# Patient Record
Sex: Female | Born: 1985 | Race: White | Hispanic: No | Marital: Married | Smoking: Never smoker
Health system: Southern US, Community
[De-identification: ages and names within clinical notes are randomized; demographics above are authoritative.]

## PROBLEM LIST (undated history)

## (undated) DIAGNOSIS — F419 Anxiety disorder, unspecified: Secondary | ICD-10-CM

## (undated) DIAGNOSIS — O4590 Premature separation of placenta, unspecified, unspecified trimester: Secondary | ICD-10-CM

## (undated) DIAGNOSIS — O1490 Unspecified pre-eclampsia, unspecified trimester: Secondary | ICD-10-CM

## (undated) DIAGNOSIS — O1420 HELLP syndrome (HELLP), unspecified trimester: Secondary | ICD-10-CM

## (undated) HISTORY — DX: Morbid (severe) obesity due to excess calories: E66.01

## (undated) HISTORY — DX: Unspecified pre-eclampsia, unspecified trimester: O14.90

## (undated) HISTORY — DX: Premature separation of placenta, unspecified, unspecified trimester: O45.90

## (undated) HISTORY — PX: DEBRIDEMENT AND CLOSURE WOUND: SHX5614

## (undated) HISTORY — DX: Anxiety disorder, unspecified: F41.9

## (undated) HISTORY — DX: HELLP syndrome (HELLP), unspecified trimester: O14.20

---
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
## 2007-03-21 IMAGING — US US RENAL KIDNEY
1 series · 17 of 25 positions shown · non-contrast
Comparison: none

REASON FOR EXAM: Oliguria
COMMENTS:

[Series 1: us renal kidney · 17 of 33 slices shown]
[im 1/33]
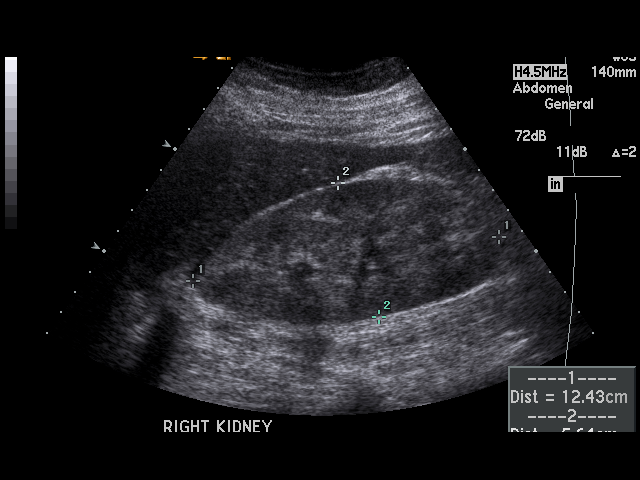
[im 3/33]
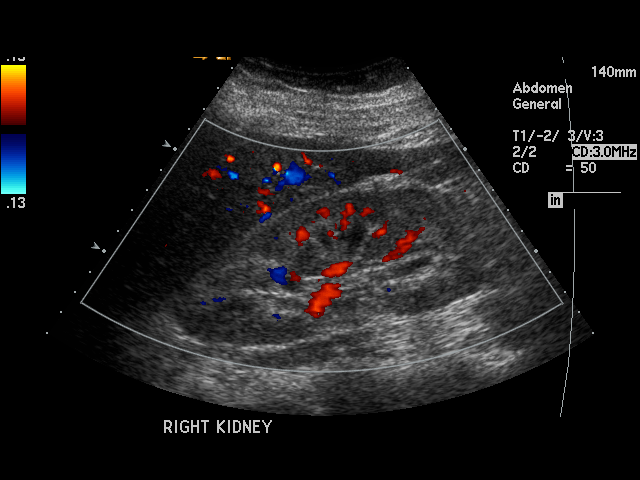
[im 5/33]
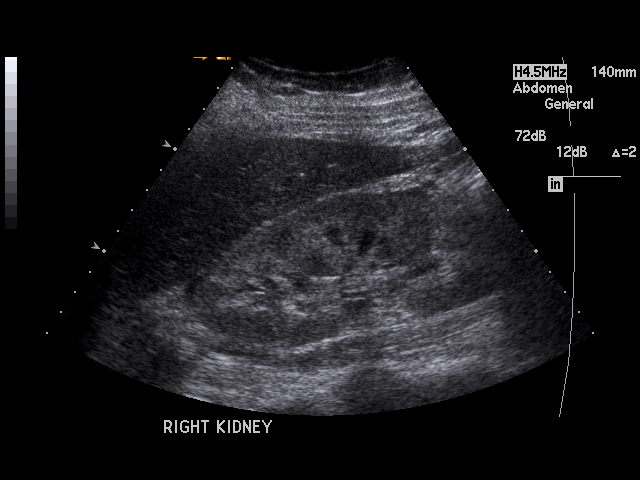
[im 7/33]
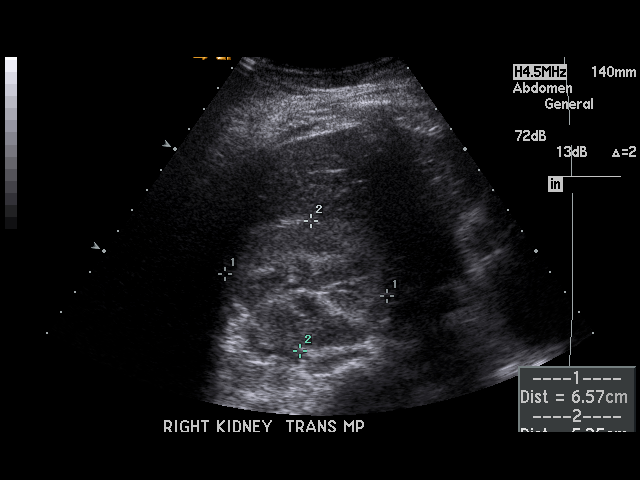
[im 9/33]
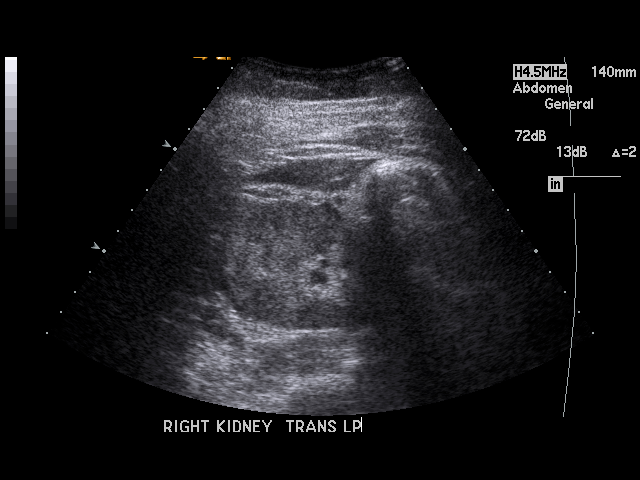
[im 11/33]
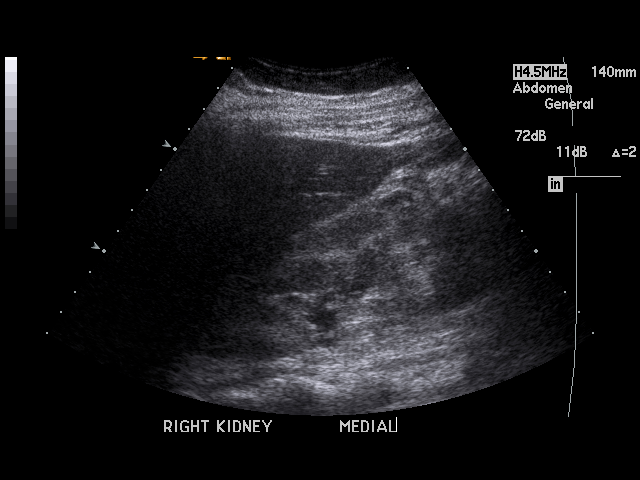
[im 13/33]
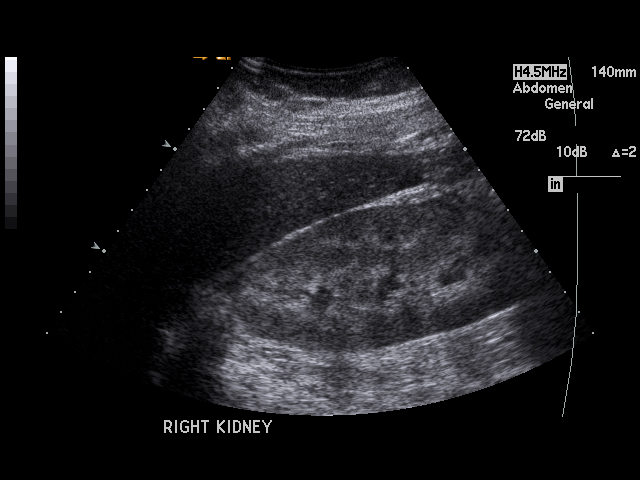
[im 15/33]
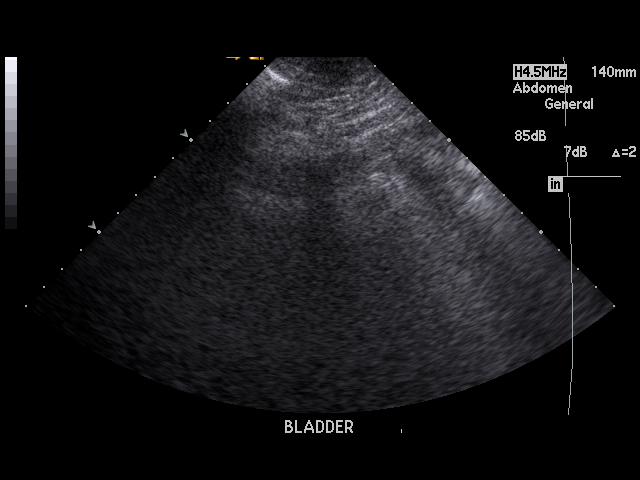
[im 17/33]
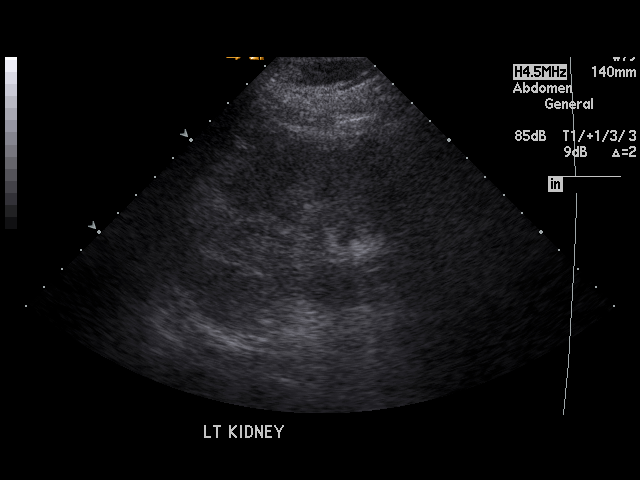
[im 18/33]
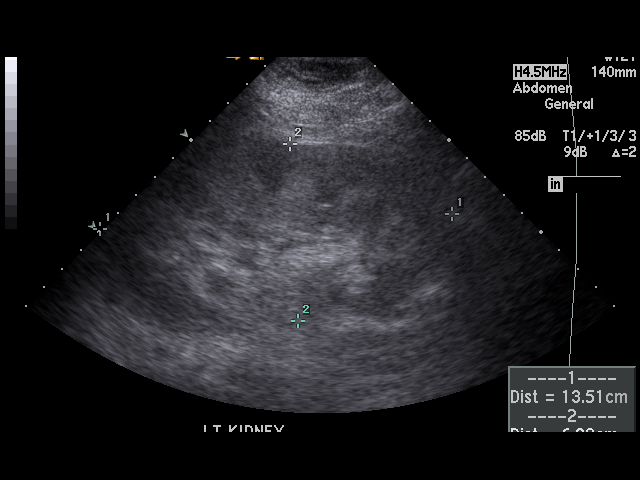
[im 21/33]
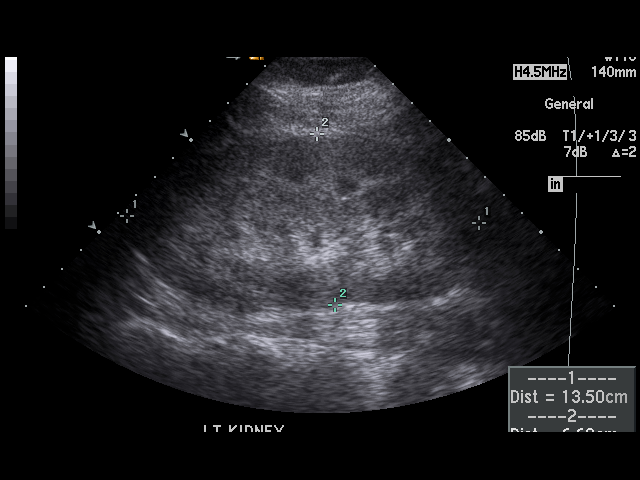
[im 22/33]
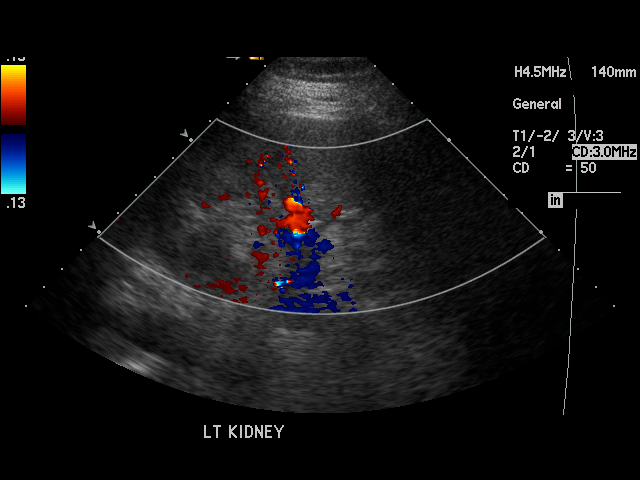
[im 25/33]
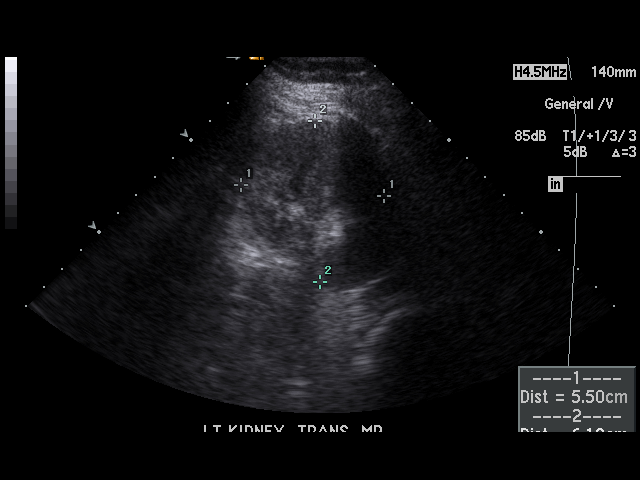
[im 26/33]
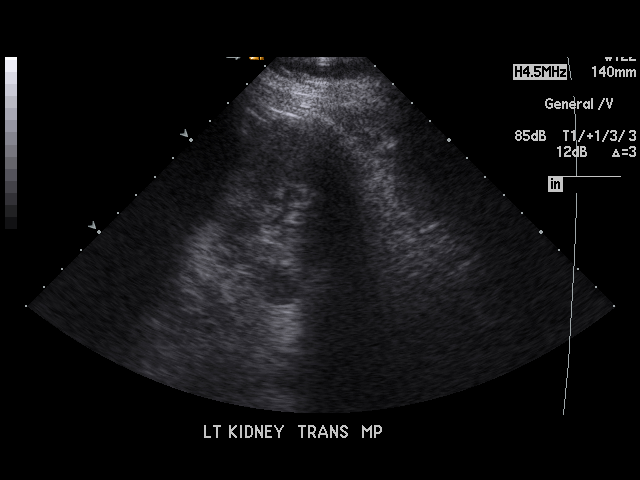
[im 29/33]
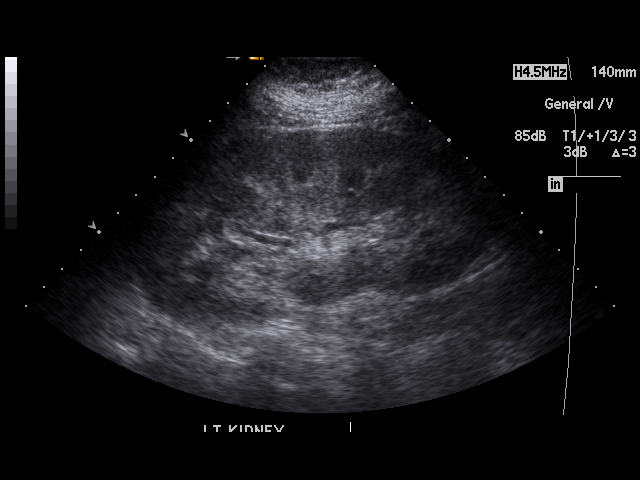
[im 30/33]
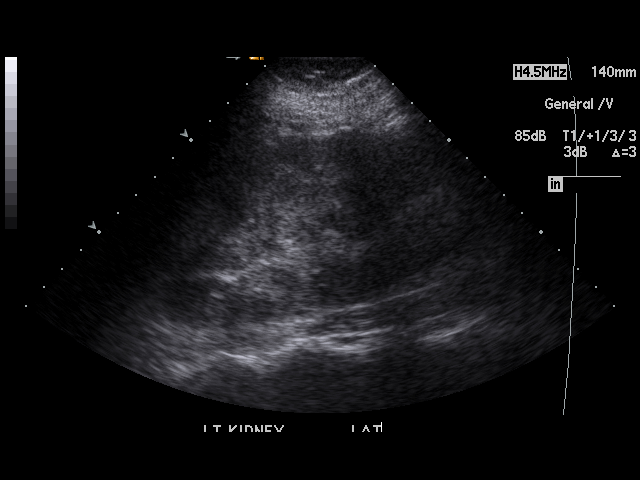
[im 33/33]
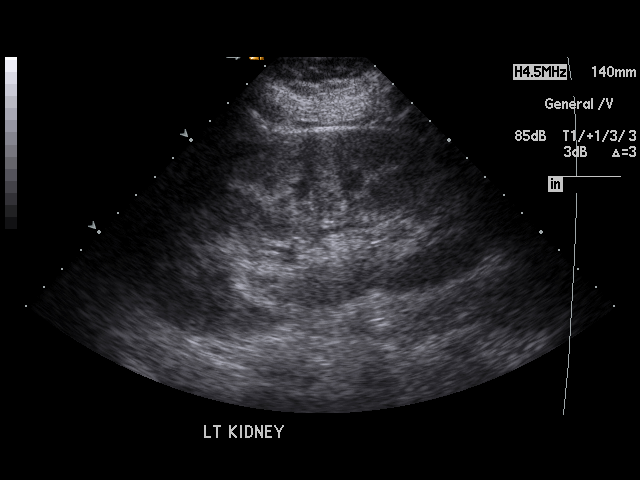

[17 of 25 positions shown; findings below may reference images not displayed]

PROCEDURE:     US  - US KIDNEY BILATERAL  - May 11, 2005 [DATE]

RESULT:          The study was done portably due to postpartum oliguria.

Both RIGHT and LEFT kidneys exhibit normal echotexture.  The LEFT kidney
measures approximately 13.5 cm in greatest dimension.  The echotexture of
the cortex is normal.  The urinary bladder was nondistended but was
partially obscured due to bowel gas.  The RIGHT kidney measures
approximately 13 cm in length.  Both kidneys demonstrate normal vascularity.
IMPRESSION: I do not see evidence of obstruction nor structural
abnormalities of the kidneys.

The findings were called to the floor at the conclusion of the study.

## 2007-08-10 ENCOUNTER — Emergency Department: Payer: Self-pay | Admitting: Emergency Medicine

## 2009-06-01 ENCOUNTER — Emergency Department: Payer: Self-pay | Admitting: Emergency Medicine

## 2013-02-15 ENCOUNTER — Ambulatory Visit: Payer: Self-pay | Admitting: Obstetrics and Gynecology

## 2014-09-12 ENCOUNTER — Emergency Department: Payer: Self-pay | Admitting: Emergency Medicine

## 2014-10-20 NOTE — Op Note (Signed)
PATIENT NAME:  Martha Mack, Martha Mack MR#:  [id number] DATE OF BIRTH:  11-04-1985  DATE OF PROCEDURE:  02/15/2013  PREOPERATIVE DIAGNOSES:  Retained Mirena intrauterine device.   POSTOPERATIVE DIAGNOSIS:  Retained Mirena intrauterine device.  OPERATION PERFORMED:  Removal of intrauterine device under anesthesia and placement of a new Mirena intrauterine device.   PRIMARY SURGEON: Florina Oundreas M. Bonney AidStaebler, MD  ESTIMATED BLOOD LOSS: Minimal.   PREOPERATIVE ANTIBIOTICS: None.   SPECIMENS REMOVED: None.   IMPLANTS: Mirena IUD.   COMPLICATIONS: None.   INTRAOPERATIVE FINDINGS: IUD was noted in the lower uterine segment on ultrasound.  It was removed using uterine packing forceps without difficulty. Inspection of the IUD noted that the ring which usually contains the IUD strings had broken, which explains why the IUD strings pulled off during attempted removal of the device by another provider in the office. The uterus sounded to 8.5 cm and the new IUD was placed without difficulty. The strings were cut to 4 cm.   PATIENT CONDITION FOLLOWING PROCEDURE: Stable.   PROCEDURE IN DETAIL: Risks, benefits and alternatives of the procedure were discussed with the patient prior to proceeding to the Operating Room. The patient was taken to the Operating Room where general anesthesia was administered and an LMA airway was administered. The patient was positioned in the dorsal lithotomy position, prepped and draped in the usual sterile fashion. A timeout procedure was performed.   A sterile speculum was placed. The anterior lip of the cervix was grasped with a single-tooth tenaculum and a uterine dressing forceps was used to remove retained intrauterine device which was undertaken without difficulty, did not require hysteroscopy. Following removal of the intrauterine device, the device was inspected. The lower portion of device which usually has a small eyelet holding the strings was noted to be broken off. The new  device was placed after sounding the uterus to 8.5 cm using a uterine sound. The IUD was placed without difficulty. Strings were cut at 4 cm.   The patient will return in 6 weeks for string check and trimming of strings if needed. Sponge, needle, and instrument counts were correct x 2. The patient tolerated the procedure well and was taken to the recovery room in stable condition.      ____________________________ Florina OuAndreas M. Bonney AidStaebler, MD ams:cs D: 02/16/2013 19:56:49 ET T: 02/16/2013 20:56:47 ET JOB#: [id number]  cc: Florina OuAndreas M. Bonney AidStaebler, MD, <Dictator> Lorrene ReidANDREAS M Eulises Kijowski MD ELECTRONICALLY SIGNED 03/01/2013 22:42

## 2016-07-22 IMAGING — CR DG ANKLE COMPLETE 3+V*L*
1 series · 3 of 3 positions shown · non-contrast
Comparison: None.

CLINICAL DATA: Patient has posterior ankle and heel pain since
[REDACTED]. No known injury.

EXAM:
LEFT ANKLE COMPLETE - 3+ VIEW

[Series 1: ap · 0.17mm/px · 3 of 3 slices shown]
[im 1/3]
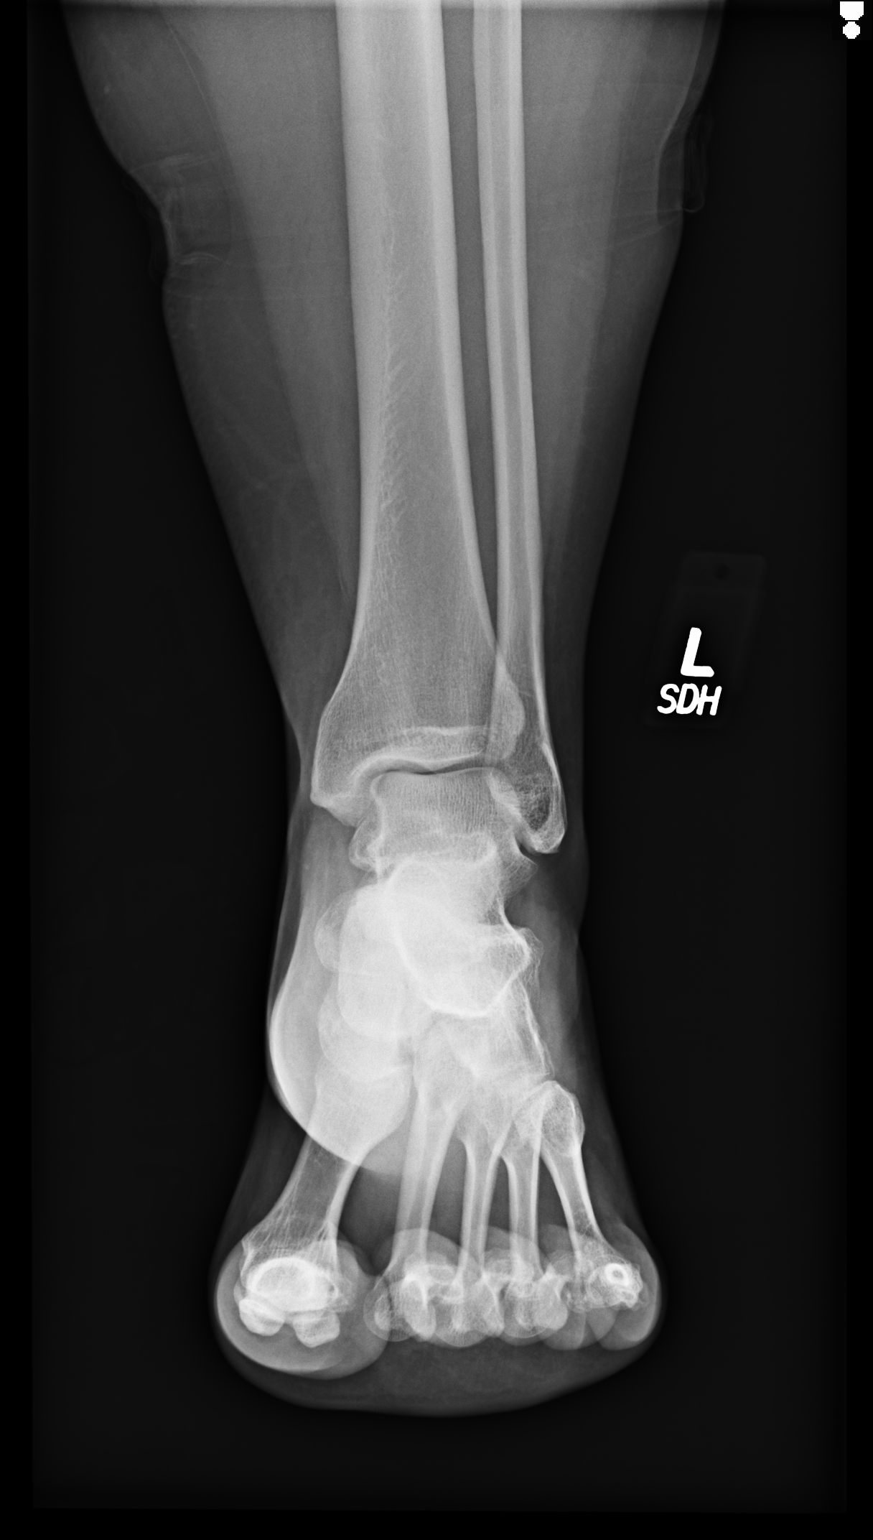
[im 2/3]
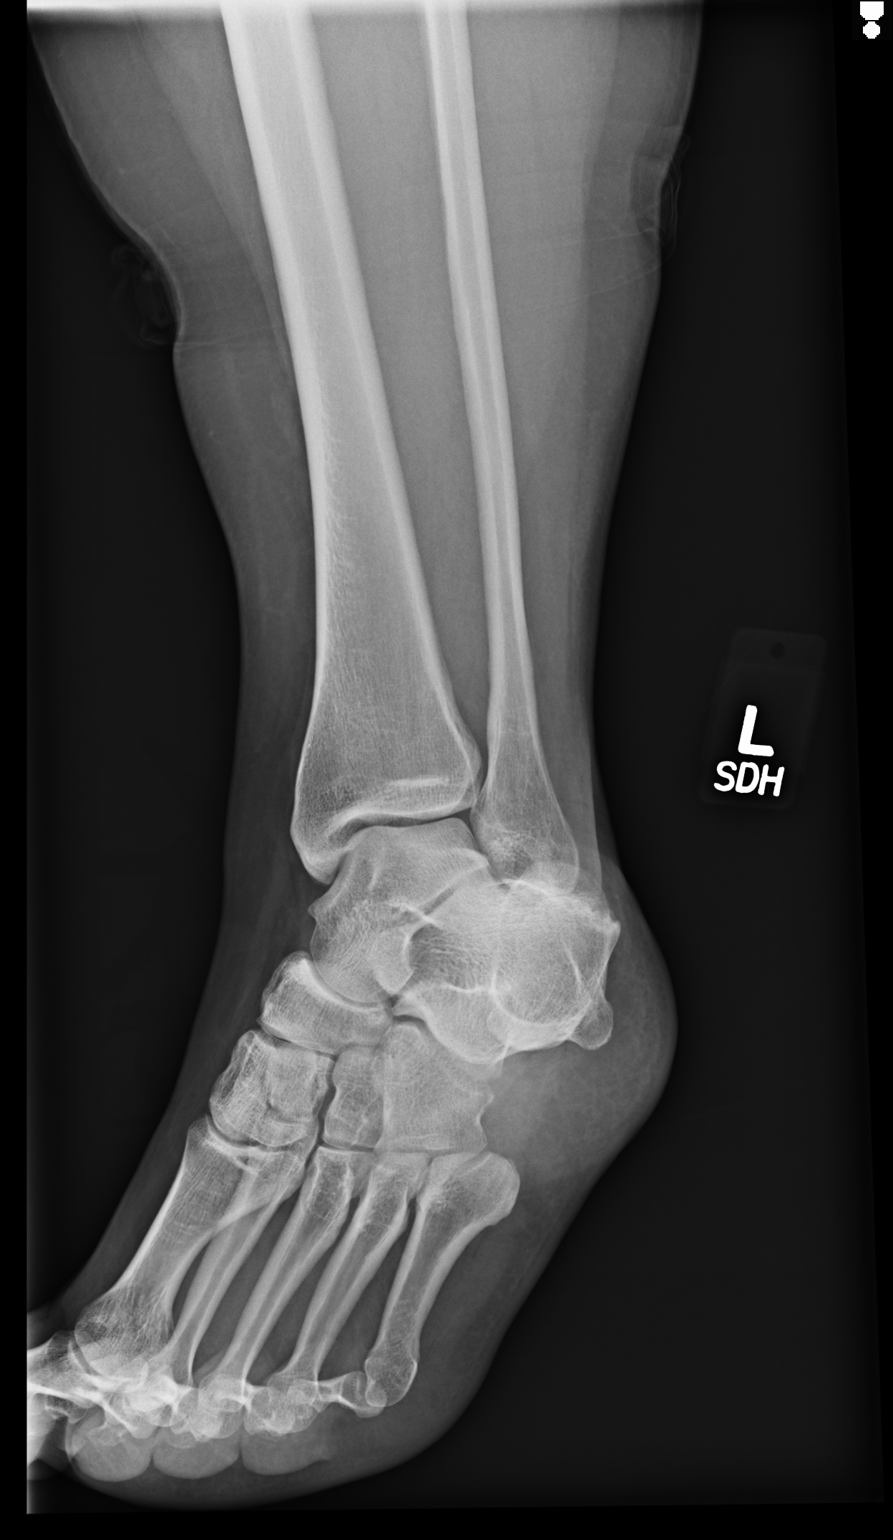
[im 3/3]
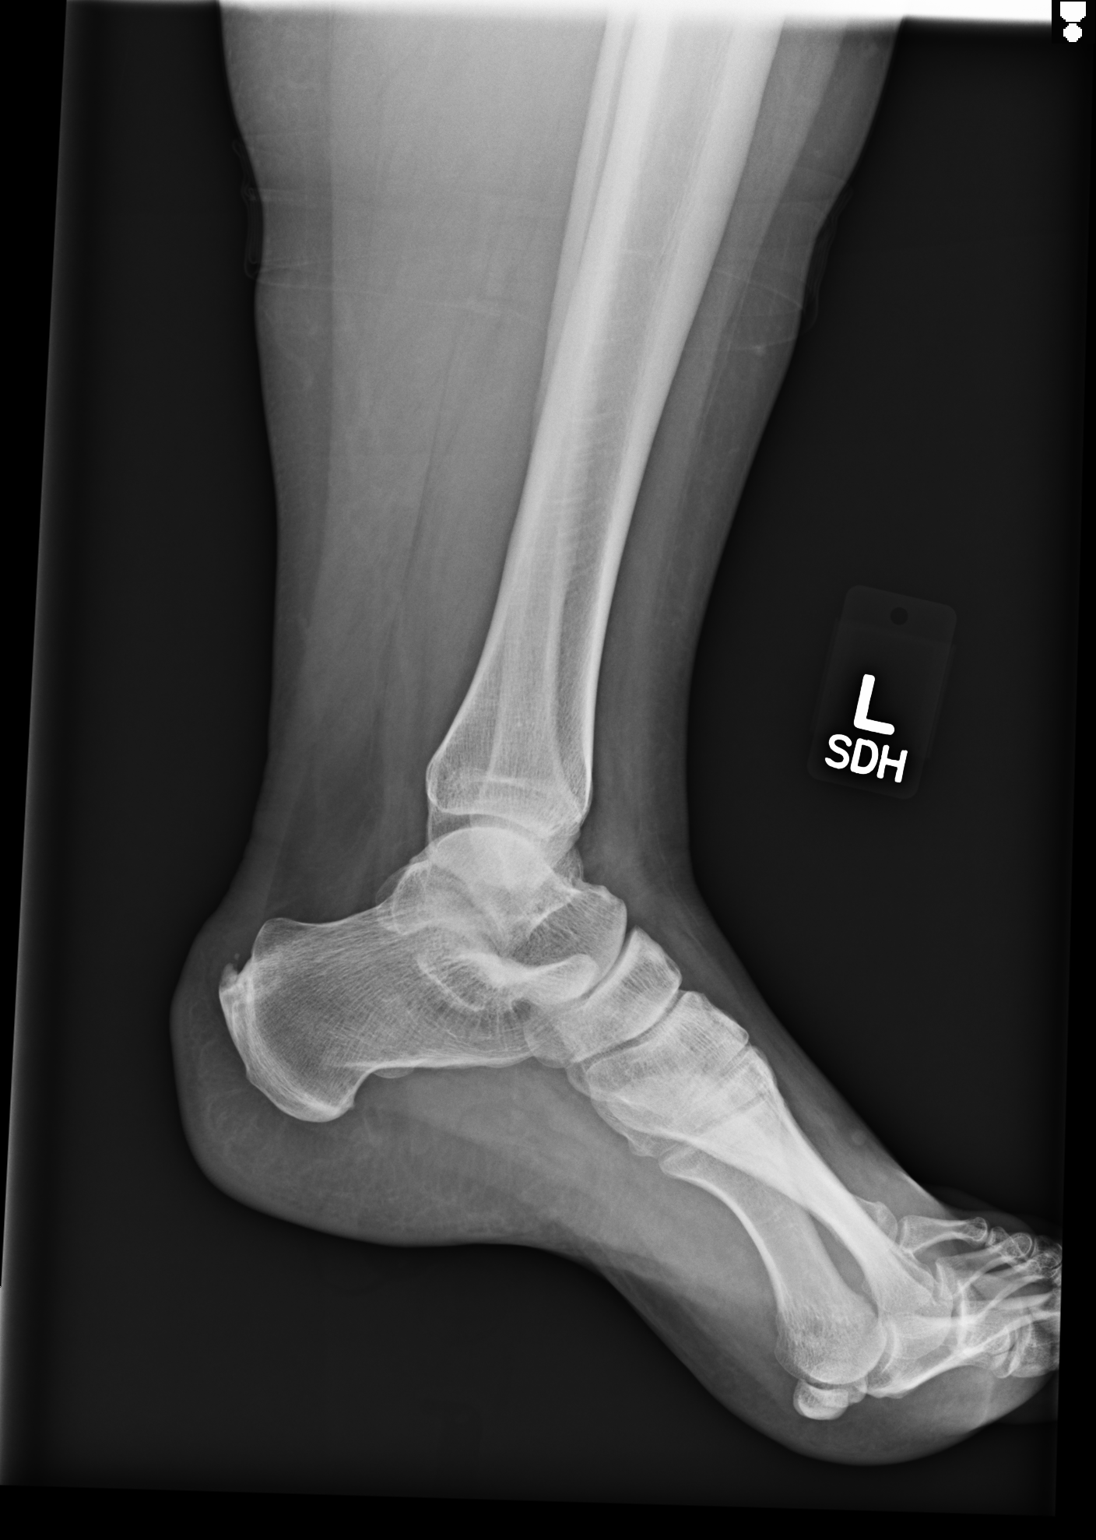

[3 of 3 positions shown; findings below may reference images not displayed]

FINDINGS: There is no evidence of fracture, dislocation, or joint effusion.
Soft tissues are unremarkable. Small Achilles spur present.
IMPRESSION: No evidence for acute  abnormality.

## 2017-04-11 ENCOUNTER — Encounter: Payer: Self-pay | Admitting: Emergency Medicine

## 2017-04-11 DIAGNOSIS — Z5321 Procedure and treatment not carried out due to patient leaving prior to being seen by health care provider: Secondary | ICD-10-CM | POA: Insufficient documentation

## 2017-04-11 LAB — URINALYSIS, COMPLETE (UACMP) WITH MICROSCOPIC
BILIRUBIN URINE: NEGATIVE
GLUCOSE, UA: NEGATIVE mg/dL
KETONES UR: 5 mg/dL — AB
Nitrite: NEGATIVE
PH: 5 (ref 5.0–8.0)
PROTEIN: 30 mg/dL — AB
Specific Gravity, Urine: 1.034 — ABNORMAL HIGH (ref 1.005–1.030)

## 2017-04-11 LAB — POCT PREGNANCY, URINE: Preg Test, Ur: NEGATIVE

## 2017-04-11 NOTE — ED Triage Notes (Signed)
Pt arrives ambulatory to triage with c/o stiff neck x 1 week. Pt denies injury or trauma to neck. Pt is able to lower her chin to neck and raise it up as well. However, pt states that she unable to turn her head left and right. Pt is able to talk in complete sentences without distress and is otherwise in NAD.

## 2017-04-12 ENCOUNTER — Emergency Department
Admission: EM | Admit: 2017-04-12 | Discharge: 2017-04-12 | Disposition: A | Discharge status: Home / Self Care | Payer: Self-pay | Attending: Emergency Medicine | Admitting: Emergency Medicine

## 2018-12-09 ENCOUNTER — Telehealth: Payer: Self-pay | Admitting: Certified Nurse Midwife

## 2018-12-09 NOTE — Telephone Encounter (Signed)
Noted. Will order to arrive by apt date/time. 

## 2018-12-09 NOTE — Telephone Encounter (Signed)
6/30 at 130 with clg for mirena

## 2018-12-28 ENCOUNTER — Other Ambulatory Visit (HOSPITAL_COMMUNITY)
Admission: RE | Admit: 2018-12-28 | Discharge: 2018-12-28 | Disposition: A | Discharge status: Home / Self Care | Payer: 59 | Source: Ambulatory Visit | Attending: Certified Nurse Midwife | Admitting: Certified Nurse Midwife

## 2018-12-28 ENCOUNTER — Other Ambulatory Visit: Payer: Self-pay

## 2018-12-28 ENCOUNTER — Encounter: Payer: Self-pay | Admitting: Certified Nurse Midwife

## 2018-12-28 ENCOUNTER — Ambulatory Visit: Payer: 59 | Admitting: Certified Nurse Midwife

## 2018-12-28 VITALS — BP 124/78 | Ht 66.0 in | Wt 261.0 lb

## 2018-12-28 DIAGNOSIS — Z124 Encounter for screening for malignant neoplasm of cervix: Secondary | ICD-10-CM | POA: Diagnosis present

## 2018-12-28 DIAGNOSIS — Z113 Encounter for screening for infections with a predominantly sexual mode of transmission: Secondary | ICD-10-CM

## 2018-12-28 DIAGNOSIS — Z30433 Encounter for removal and reinsertion of intrauterine contraceptive device: Secondary | ICD-10-CM

## 2018-12-28 DIAGNOSIS — Z87448 Personal history of other diseases of urinary system: Secondary | ICD-10-CM

## 2018-12-28 DIAGNOSIS — F419 Anxiety disorder, unspecified: Secondary | ICD-10-CM

## 2018-12-28 DIAGNOSIS — Z6841 Body Mass Index (BMI) 40.0 and over, adult: Secondary | ICD-10-CM

## 2018-12-28 DIAGNOSIS — Z131 Encounter for screening for diabetes mellitus: Secondary | ICD-10-CM

## 2018-12-28 DIAGNOSIS — Z13220 Encounter for screening for lipoid disorders: Secondary | ICD-10-CM

## 2018-12-28 NOTE — Telephone Encounter (Signed)
Mirena IUD used for pt today with CG

## 2018-12-29 ENCOUNTER — Encounter: Payer: Self-pay | Admitting: Certified Nurse Midwife

## 2018-12-29 DIAGNOSIS — O1490 Unspecified pre-eclampsia, unspecified trimester: Secondary | ICD-10-CM | POA: Insufficient documentation

## 2018-12-29 DIAGNOSIS — O4590 Premature separation of placenta, unspecified, unspecified trimester: Secondary | ICD-10-CM | POA: Insufficient documentation

## 2018-12-29 DIAGNOSIS — F419 Anxiety disorder, unspecified: Secondary | ICD-10-CM | POA: Insufficient documentation

## 2018-12-29 DIAGNOSIS — O1420 HELLP syndrome (HELLP), unspecified trimester: Secondary | ICD-10-CM | POA: Insufficient documentation

## 2018-12-29 LAB — CMP14+EGFR
ALT: 26 IU/L (ref 0–32)
AST: 18 IU/L (ref 0–40)
Albumin/Globulin Ratio: 1.9 (ref 1.2–2.2)
Albumin: 4.6 g/dL (ref 3.8–4.8)
Alkaline Phosphatase: 44 IU/L (ref 39–117)
BUN/Creatinine Ratio: 11 (ref 9–23)
BUN: 8 mg/dL (ref 6–20)
Bilirubin Total: 0.5 mg/dL (ref 0.0–1.2)
CO2: 17 mmol/L — ABNORMAL LOW (ref 20–29)
Calcium: 9.5 mg/dL (ref 8.7–10.2)
Chloride: 103 mmol/L (ref 96–106)
Creatinine, Ser: 0.71 mg/dL (ref 0.57–1.00)
GFR calc Af Amer: 131 mL/min/{1.73_m2} (ref >59)
GFR calc non Af Amer: 114 mL/min/{1.73_m2} (ref >59)
Globulin, Total: 2.4 g/dL (ref 1.5–4.5)
Glucose: 92 mg/dL (ref 65–99)
Potassium: 4 mmol/L (ref 3.5–5.2)
Sodium: 139 mmol/L (ref 134–144)
Total Protein: 7 g/dL (ref 6.0–8.5)

## 2018-12-29 LAB — LIPID PANEL
Chol/HDL Ratio: 4.4 ratio (ref 0.0–4.4)
Cholesterol, Total: 192 mg/dL (ref 100–199)
HDL: 44 mg/dL (ref >39)
LDL Calculated: 124 mg/dL — ABNORMAL HIGH (ref 0–99)
Triglycerides: 119 mg/dL (ref 0–149)
VLDL Cholesterol Cal: 24 mg/dL (ref 5–40)

## 2018-12-29 LAB — TSH: TSH: 2.26 u[IU]/mL (ref 0.450–4.500)

## 2018-12-29 LAB — HEMOGLOBIN A1C
Est. average glucose Bld gHb Est-mCnc: 103 mg/dL
Hgb A1c MFr Bld: 5.2 % (ref 4.8–5.6)

## 2018-12-29 NOTE — Progress Notes (Addendum)
Obstetrics & Gynecology Office Visit   Chief Complaint:  Chief Complaint  Patient presents with  . Contraception    IUD replacement     History of Present Illness: 33 year old G2 P1101 presented as a NP for an IUD replacement. She had her last Mirena IUD removed under anesthesia and the current Mirena was inserted 02/15/2013. She has not been seen at [facility] since then and has not had a gyn exam or Pap smear since 12/30/2012. She reports that she has been amenorrheic, spotting on rare occasion, like today. Is very anxious regarding this procedure.  Past medical history is significant for migraine with aura (about once every 2 months) and tension headaches (once/week), obesity and anxiety.  She has difficulty losing weight by limiting caloric intake to 1700 cal/day and walking 3x/week.  Her past medical history is also significant for having Cesarean sections x 2, fetal loss with her second pregnancy from placental abruption due to HELLP/ severe preeclampsia (also had renal failure), and postpartum depression.  Last Pap smear: 12/30/2012 NIL    Review of Systems:  Review of Systems  Constitutional: Negative for chills, fever and weight loss.  HENT: Negative for congestion, sinus pain and sore throat.   Eyes: Negative for blurred vision and pain.  Respiratory: Negative for hemoptysis, shortness of breath and wheezing.   Cardiovascular: Negative for chest pain, palpitations and leg swelling.  Gastrointestinal: Negative for abdominal pain, blood in stool, diarrhea, heartburn, nausea and vomiting.  Genitourinary: Negative for dysuria, frequency, hematuria and urgency.  Musculoskeletal: Positive for joint pain. Negative for back pain and myalgias.  Skin: Negative for itching and rash.  Neurological: Positive for headaches. Negative for dizziness and tingling.  Endo/Heme/Allergies: Negative for environmental allergies and polydipsia. Does not bruise/bleed easily.       Negative for hirsutism    Psychiatric/Behavioral: Negative for depression. The patient is nervous/anxious. The patient does not have insomnia.      Past Medical History:  Past Medical History:  Diagnosis Date  . Anxiety   . Fetal demise, greater than 22 weeks, antepartum 2006   from abruption with G2  . HELLP syndrome    2006-with renal failure  . Morbid obesity with BMI of 40.0-44.9, adult ([place])   . Placental abruption   . Postpartum depression associated with second pregnancy 2007  . Pre-eclampsia    G2    Past Surgical History:  Past Surgical History:  Procedure Laterality Date  . CESAREAN SECTION  2005/2006  . DEBRIDEMENT AND CLOSURE WOUND    . HYSTEROSCOPY W/D&C  02/27/2004  . INTRAUTERINE DEVICE (IUD) INSERTION  02/16/2013  . IUD REMOVAL  02/16/2013    Gynecologic History: No LMP recorded. (Menstrual status: IUD).  Obstetric History: G2P1101  Family History:  Family History  Problem Relation Age of Onset  . Stroke Father   . Heart disease Maternal Grandfather   . Breast cancer Other 79  . Cerebral palsy Paternal Aunt   . Lung cancer Other   . Heart disease Maternal Uncle     Social History:  Social History   Socioeconomic History  . Marital status: Married    Spouse name: Darnelle Maffucci  . Number of children: 1  . Years of education: Not on file  . Highest education level: Not on file  Occupational History  . Not on file  Social Needs  . Financial resource strain: Not on file  . Food insecurity    Worry: Not on file    Inability:  Not on file  . Transportation needs    Medical: Not on file    Non-medical: Not on file  Tobacco Use  . Smoking status: Never Smoker  . Smokeless tobacco: Never Used  Substance and Sexual Activity  . Alcohol use: No  . Drug use: No  . Sexual activity: Yes    Birth control/protection: I.U.D.  Lifestyle  . Physical activity    Days per week: Not on file    Minutes per session: Not on file  . Stress: Not on file  Relationships  . Social  Herbalist on phone: Not on file    Gets together: Not on file    Attends religious service: Not on file    Active member of club or organization: Not on file    Attends meetings of clubs or organizations: Not on file    Relationship status: Not on file  . Intimate partner violence    Fear of current or ex partner: Not on file    Emotionally abused: Not on file    Physically abused: Not on file    Forced sexual activity: Not on file  Other Topics Concern  . Not on file  Social History Narrative  . Not on file    Allergies:  Allergies  Allergen Reactions  . Ceclor [Cefaclor] Hives  . Morphine And Related Hives    Medications: Prior to Admission medications   Medication Sig Start Date End Date Taking? Authorizing Provider  levonorgestrel (MIRENA) 20 MCG/24HR IUD 1 each by Intrauterine route once.   Yes [provider]    Physical Exam Vitals:BP 124/78   Ht 5' 6"  (1.676 m)   Wt 261 lb (118.4 kg)   BMI 42.13 kg/m       Physical Exam  Constitutional: She is oriented to person, place, and time.  Pleasant, WF, appears to be very anxious  HENT:  Head: Normocephalic and atraumatic.  GI: Soft. She exhibits no distension and no mass. There is no abdominal tenderness.  Genitourinary:    Genitourinary Comments: Vulva: no lesions Vagina: small amount brown discharge Cervix: IUD strings seen, no lesions Uterus: AV. NSSC, mobile, NT Adnexa: no masses, NT    Neurological: She is alert and oriented to person, place, and time.  Psychiatric:  Anxious     Assessment: 33 y.o. [id number] NP for IUD replacement  Plan: Pap smear and cultures done Labs today after IUD replacement Problem List Items Addressed This Visit      Other   Anxiety    Other Visit Diagnoses    Screening for STD (sexually transmitted disease)    -  Primary   Relevant Orders   Cytology - PAP   Screening for cervical cancer       Relevant Orders   Cytology - PAP   Class 3 severe  obesity due to excess calories without serious comorbidity with body mass index (BMI) of 40.0 to 44.9 in adult ([place])       Relevant Orders   TSH (Completed)   Hemoglobin A1c (Completed)   Screening for diabetes mellitus       Relevant Orders   Hemoglobin A1c (Completed)   Screening for hypercholesterolemia       Relevant Orders   Lipid panel (Completed)   History of renal failure       Relevant Orders   CMP14+EGFR (Completed)   Encounter for removal and reinsertion of intrauterine contraceptive device (IUD)  See procedure note below RTO 1 mos for follow up and completion of annual exam  Dalia Heading, [place] OFFICE PROCEDURE NOTE  LASHYA PASSE is a 33 y.o. G2P1101 here for Mirena IUD replacement.  Last pap smear was done today and results are pending  IUD Insertion Procedure Note Patient identified, informed consent performed, consent signed.   Discussed risks of irregular bleeding, cramping, infection, expulsion,malpositioning or misplacement of the IUD outside the uterus which may require further procedure such as laparoscopy. Time out was performed.    On bimanual exam, uterus was Anteverted. Speculum placed in the vagina.  Cervix visualized. IUD strings grasped with a packing forceps and the IUD was removed easily and intact. The cervix was then cleaned with Betadine x 2. Cervix was sprayed with Hurricaine anesthetic and  grasped anteriorly with a single tooth tenaculum.  Uterus sounded to 6 cm.  Mirena  IUD placed per manufacturer's recommendations.  Strings trimmed to 3 cm. Tenaculum was removed, and silver nitrate was applied to tenaculum sites for hemostasis.  Patient tolerated procedure well.   Patient was given post-procedure instructions.  Patient was also asked to check IUD strings periodically and follow up in 4 weeks for IUD check/ annual exam.  Dalia Heading, CNM 12/29/18

## 2018-12-30 LAB — CYTOLOGY - PAP
Chlamydia: NEGATIVE
Diagnosis: NEGATIVE
HPV: NOT DETECTED
Neisseria Gonorrhea: NEGATIVE

## 2018-12-30 NOTE — Telephone Encounter (Signed)
Mirena/removal/insertion charged 12/28/2018

## 2019-01-27 ENCOUNTER — Ambulatory Visit: Payer: 59 | Admitting: Certified Nurse Midwife

## 2019-02-22 ENCOUNTER — Ambulatory Visit: Payer: 59 | Admitting: Certified Nurse Midwife

## 2019-04-07 ENCOUNTER — Ambulatory Visit: Payer: 59 | Admitting: Certified Nurse Midwife

## 2019-05-20 ENCOUNTER — Ambulatory Visit: Payer: 59 | Admitting: Certified Nurse Midwife

## 2020-01-11 ENCOUNTER — Ambulatory Visit: Payer: 59 | Admitting: Internal Medicine

## 2022-01-30 ENCOUNTER — Encounter (INDEPENDENT_AMBULATORY_CARE_PROVIDER_SITE_OTHER): Payer: 59 | Admitting: Ophthalmology

## 2022-01-31 ENCOUNTER — Encounter (INDEPENDENT_AMBULATORY_CARE_PROVIDER_SITE_OTHER): Payer: Self-pay | Admitting: Ophthalmology

## 2022-01-31 ENCOUNTER — Ambulatory Visit (INDEPENDENT_AMBULATORY_CARE_PROVIDER_SITE_OTHER): Payer: BC Managed Care – PPO | Admitting: Ophthalmology

## 2022-01-31 DIAGNOSIS — H33311 Horseshoe tear of retina without detachment, right eye: Secondary | ICD-10-CM | POA: Diagnosis not present

## 2022-01-31 DIAGNOSIS — H53002 Unspecified amblyopia, left eye: Secondary | ICD-10-CM

## 2022-01-31 NOTE — Progress Notes (Signed)
[facility] - Clinic Note  01/31/2022     CHIEF COMPLAINT Patient presents for Retina Evaluation   HISTORY OF PRESENT ILLNESS: Martha Mack is a 36 y.o. female who presents to the clinic today for:   HPI     Retina Evaluation   In right eye.  This started 2 weeks ago.  Duration of 6 days.  Associated Symptoms Floaters.  I, the attending physician,  performed the HPI with the patient and updated documentation appropriately.        Comments   Patient here for Retina Evaluation. Referred by Dr Clydene Pugh. Patient states vision OD floaters and black spots not as bad and was 3 days ago. Was seeing floaters and a lot of black spots like pepper falling and double vision. Double vision comes and goes. The floaters are staying and black spots not as noticeable. No eye pain.       Last edited by Rennis Chris, MD on 01/31/2022 12:59 PM.    Pt is here on the referral of Dr. 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 [age] years old, she used a patch for awhile, she states about 2 weeks ago, she got new floaters and "black specks" in her right eye, she normally gets floaters before a migraine and then they go away, but these have stayed around, she states a few months ago, she saw fol that looked like "lightening streaks", but they have gone away, she states the floaters have moved temporally, pt denies any health problems or taking any medication, pt states her dog hit her in the right eye with his tail about 3 weeks ago  Referring physician: Isla Pence, OD [address] [place],  [place] [address]  HISTORICAL INFORMATION:   Selected notes from the MEDICAL RECORD NUMBER Referred by Dr. Clydene Pugh for concern of retinal hole OD LEE:  Ocular Hx- PMH-    CURRENT MEDICATIONS: No current outpatient medications on file. (Ophthalmic Drugs)   No current facility-administered medications for this visit. (Ophthalmic Drugs)   Current  Outpatient Medications (Other)  Medication Sig   ALPRAZolam (XANAX) 0.5 MG tablet TAKE 1 TABLET BY MOUTH BEFORE FLYING AS NEEDED (Patient not taking: Reported on 01/31/2022)   levonorgestrel (MIRENA) 20 MCG/24HR IUD 1 each by Intrauterine route once.   metoprolol succinate (TOPROL-XL) 50 MG 24 hr tablet Take 1 tablet by mouth daily. (Patient not taking: Reported on 01/31/2022)   No current facility-administered medications for this visit. (Other)   REVIEW OF SYSTEMS: ROS   Positive for: Eyes Negative for: Constitutional, Gastrointestinal, Neurological, Skin, Genitourinary, Musculoskeletal, HENT, Endocrine, Cardiovascular, Respiratory, Psychiatric, Allergic/Imm, Heme/Lymph Last edited by Rennis Chris, MD on 01/31/2022  4:05 PM.     ALLERGIES Allergies  Allergen Reactions   Ceclor [Cefaclor] Hives   Morphine And Related Hives   PAST MEDICAL HISTORY Past Medical History:  Diagnosis Date   Anxiety    Fetal demise, greater than 22 weeks, antepartum 2006   from abruption with G2   HELLP syndrome    2006-with renal failure   Morbid obesity with BMI of 40.0-44.9, adult ([facility])    Placental abruption    Postpartum depression associated with second pregnancy 2007   Pre-eclampsia    G2   Past Surgical History:  Procedure Laterality Date   CESAREAN SECTION  2005/2006   DEBRIDEMENT AND CLOSURE WOUND     HYSTEROSCOPY WITH D & C  02/27/2004  INTRAUTERINE DEVICE (IUD) INSERTION  02/16/2013   IUD REMOVAL  02/16/2013   FAMILY HISTORY Family History  Problem Relation Age of Onset   Stroke Father    Heart disease Maternal Grandfather    Breast cancer Other 8   Cerebral palsy Paternal Aunt    Lung cancer Other    Heart disease Maternal Uncle    SOCIAL HISTORY Social History   Tobacco Use   Smoking status: Never   Smokeless tobacco: Never  Vaping Use   Vaping Use: Never used  Substance Use Topics   Alcohol use: No   Drug use: No       OPHTHALMIC EXAM:  Base Eye Exam      Visual Acuity (Snellen - Linear)       Right Left   Dist [place] 20/20 20/200 -1   Dist ph [place]  20/200 +2         Tonometry (Tonopen, 9:54 AM)       Right Left   Pressure 18 20         Pupils       Dark Light Shape React APD   Right 3 2 Round Brisk None   Left 3 2 Round Brisk None         Visual Fields (Counting fingers)       Left Right    Full Full         Extraocular Movement       Right Left    Full, Ortho Full, Ortho         Neuro/Psych     Oriented x3: Yes   Mood/Affect: Normal         Dilation     Both eyes: 1.0% Mydriacyl, 2.5% Phenylephrine @ 9:54 AM           Slit Lamp and Fundus Exam     Slit Lamp Exam       Right Left   Lids/Lashes Dermatochalasis - upper lid, Meibomian gland dysfunction Dermatochalasis - upper lid, Meibomian gland dysfunction   Conjunctiva/Sclera White and quiet White and quiet   Cornea trace PEE trace PEE   Anterior Chamber deep and clear deep and clear   Iris Round and dilated Round and dilated   Lens Clear Clear   Anterior Vitreous clear clear         Fundus Exam       Right Left   Disc Pink and Sharp Pink and Sharp, focal PPP temporal   C/D Ratio 0.4 0.3   Macula Flat, Good foveal reflex, No heme or edema Flat, Good foveal reflex, No heme or edema   Vessels mild copper wiring Normal   Periphery Attached, flap tear at 130 ora Attached           Refraction     Manifest Refraction (Auto)       Sphere Cylinder Axis Dist VA   Right +0.50 Sphere  20/20   Left +4.75 +2.00 071 20/80            IMAGING AND PROCEDURES  Imaging and Procedures for 01/31/2022  OCT, Retina - OU - Both Eyes       Right Eye Quality was good. Central Foveal Thickness: 288. Progression has no prior data. Findings include normal foveal contour, no IRF, no SRF, vitreomacular adhesion .   Left Eye Quality was good. Central Foveal Thickness: 303. Progression has no prior data. Findings include normal foveal contour,  no IRF, no SRF.   Notes *  Images captured and stored on drive  Diagnosis / Impression:  NFP, no IRF/SRF OU  Clinical management:  See below  Abbreviations: NFP - Normal foveal profile. CME - cystoid macular edema. PED - pigment epithelial detachment. IRF - intraretinal fluid. SRF - subretinal fluid. EZ - ellipsoid zone. ERM - epiretinal membrane. ORA - outer retinal atrophy. ORT - outer retinal tubulation. SRHM - subretinal hyper-reflective material. IRHM - intraretinal hyper-reflective material      Repair Retinal Breaks, Laser - OD - Right Eye       LASER PROCEDURE NOTE  Procedure:  Barrier laser retinopexy using laser indirect ophthalmoscope, right eye   Diagnosis:   Retinal tear, right eye                     Flap tear at 130 ora   Surgeon: Bernarda Caffey, MD, PhD  Anesthesia: Topical  Informed consent obtained, operative eye marked, and time out performed prior to initiation of laser.   Laser settings:  Lumenis [id number] laser indirect ophthalmoscope Power: 270 mW Duration: 70 msec  # spots: 340  Placement of laser: Laser was placed in three confluent rows posterior to flap tear at 130 ora.  Complications: None.  Patient tolerated the procedure well and received written and verbal post-procedure care information/education.            ASSESSMENT/PLAN:    ICD-10-CM   1. Retinal tear of right eye  H33.311 OCT, Retina - OU - Both Eyes    Repair Retinal Breaks, Laser - OD - Right Eye    2. Amblyopia of left eye  H53.002      Retinal tear, OD - The incidence, risk factors, and natural history of retinal tear was discussed with patient.   - Potential treatment options including laser retinopexy and cryotherapy discussed with patient. - small flap tear located at 0130 ora - recommend laser retinopexy today OD today, 08.04.23 - pt wishes to proceed with laser - RBA of procedure discussed, questions answered - informed consent obtained and signed - see  procedure note - start PF QID OD x7 days - f/u in 2-3 wks, DFE, OCT  2. Amblyopia OS  - patched as a child  - BCVA ~20/80 by MRx  Ophthalmic Meds Ordered this visit:  No orders of the defined types were placed in this encounter.    Return for 2-3 wks - f/u retinal tear OD, DFE, OCT.  There are no Patient Instructions on file for this visit.   Explained the diagnoses, plan, and follow up with the patient and they expressed understanding.  Patient expressed understanding of the importance of proper follow up care.   This document serves as a record of services personally performed by Gardiner Sleeper, MD, PhD. It was created on their behalf by San Jetty. Owens Shark, OA an ophthalmic technician. The creation of this record is the provider's dictation and/or activities during the visit.    Electronically signed by: San Jetty. Owens Shark, OA @TODAY @ 4:07 PM   Gardiner Sleeper, M.D., Ph.D. Diseases & Surgery of the Retina and [place]  I have reviewed the above documentation for accuracy and completeness, and I agree with the above. Gardiner Sleeper, M.D., Ph.D. 01/31/22 4:07 PM   Abbreviations: M myopia (nearsighted); A astigmatism; H hyperopia (farsighted); P presbyopia; Mrx spectacle prescription;  CTL contact lenses; OD right eye; OS left eye; OU both eyes  XT exotropia; ET esotropia; PEK punctate epithelial  keratitis; PEE punctate epithelial erosions; DES dry eye syndrome; MGD meibomian gland dysfunction; ATs artificial tears; PFAT's preservative free artificial tears; [place] nuclear sclerotic cataract; PSC posterior subcapsular cataract; ERM epi-retinal membrane; PVD posterior vitreous detachment; RD retinal detachment; DM diabetes mellitus; DR diabetic retinopathy; NPDR non-proliferative diabetic retinopathy; PDR proliferative diabetic retinopathy; CSME clinically significant macular edema; DME diabetic macular edema; dbh dot blot hemorrhages; CWS cotton wool spot;  POAG primary open angle glaucoma; C/D cup-to-disc ratio; HVF humphrey visual field; GVF goldmann visual field; OCT optical coherence tomography; IOP intraocular pressure; BRVO Branch retinal vein occlusion; CRVO central retinal vein occlusion; CRAO central retinal artery occlusion; BRAO branch retinal artery occlusion; RT retinal tear; SB scleral buckle; PPV pars plana vitrectomy; VH Vitreous hemorrhage; PRP panretinal laser photocoagulation; IVK intravitreal kenalog; VMT vitreomacular traction; MH Macular hole;  NVD neovascularization of the disc; NVE neovascularization elsewhere; AREDS age related eye disease study; ARMD age related macular degeneration; POAG primary open angle glaucoma; EBMD epithelial/anterior basement membrane dystrophy; ACIOL anterior chamber intraocular lens; IOL intraocular lens; PCIOL posterior chamber intraocular lens; Phaco/IOL phacoemulsification with intraocular lens placement; [place] photorefractive keratectomy; LASIK laser assisted in situ keratomileusis; HTN hypertension; DM diabetes mellitus; COPD chronic obstructive pulmonary disease

## 2022-02-07 NOTE — Progress Notes (Signed)
[facility] - Clinic Note  02/21/2022     CHIEF COMPLAINT Patient presents for Retina Follow Up   HISTORY OF PRESENT ILLNESS: Martha Mack is a 36 y.o. female who presents to the clinic today for:   HPI     Retina Follow Up   Patient presents with  Retinal Break/Detachment.  In right eye.  I, the attending physician,  performed the HPI with the patient and updated documentation appropriately.        Comments   Patient states that she is still seeing pepper like floaters. The vision is still the same since her last visit 08.04.23. She did the eye drops for a week as instructed.       Last edited by Rennis Chris, MD on 02/21/2022 12:37 PM.    Pt states no problems after the laser procedure, she is still seeing floaters and fol, but the fol have gotten smaller, she used the drops for a week as directed   Referring physician: No referring provider defined for this encounter.  HISTORICAL INFORMATION:   Selected notes from the MEDICAL RECORD NUMBER Referred by Dr. Clydene Pugh for concern of retinal hole OD LEE:  Ocular Hx- PMH-    CURRENT MEDICATIONS: No current outpatient medications on file. (Ophthalmic Drugs)   No current facility-administered medications for this visit. (Ophthalmic Drugs)   Current Outpatient Medications (Other)  Medication Sig   ALPRAZolam (XANAX) 0.5 MG tablet TAKE 1 TABLET BY MOUTH BEFORE FLYING AS NEEDED (Patient not taking: Reported on 01/31/2022)   levonorgestrel (MIRENA) 20 MCG/24HR IUD 1 each by Intrauterine route once.   metoprolol succinate (TOPROL-XL) 50 MG 24 hr tablet Take 1 tablet by mouth daily. (Patient not taking: Reported on 01/31/2022)   No current facility-administered medications for this visit. (Other)   REVIEW OF SYSTEMS: ROS   Positive for: Eyes Negative for: Constitutional, Gastrointestinal, Neurological, Skin, Genitourinary, Musculoskeletal, HENT, Endocrine, Cardiovascular, Respiratory, Psychiatric,  Allergic/Imm, Heme/Lymph Last edited by Julieanne Cotton, COT on 02/21/2022  9:56 AM.     ALLERGIES Allergies  Allergen Reactions   Ceclor [Cefaclor] Hives   Morphine And Related Hives   PAST MEDICAL HISTORY Past Medical History:  Diagnosis Date   Anxiety    Fetal demise, greater than 22 weeks, antepartum 2006   from abruption with G2   HELLP syndrome    2006-with renal failure   Morbid obesity with BMI of 40.0-44.9, adult (HCC)    Placental abruption    Postpartum depression associated with second pregnancy 2007   Pre-eclampsia    G2   Past Surgical History:  Procedure Laterality Date   CESAREAN SECTION  2005/2006   DEBRIDEMENT AND CLOSURE WOUND     HYSTEROSCOPY WITH D & C  02/27/2004   INTRAUTERINE DEVICE (IUD) INSERTION  02/16/2013   IUD REMOVAL  02/16/2013   FAMILY HISTORY Family History  Problem Relation Age of Onset   Stroke Father    Heart disease Maternal Grandfather    Breast cancer Other 65   Cerebral palsy Paternal Aunt    Lung cancer Other    Heart disease Maternal Uncle    SOCIAL HISTORY Social History   Tobacco Use   Smoking status: Never   Smokeless tobacco: Never  Vaping Use   Vaping Use: Never used  Substance Use Topics   Alcohol use: No   Drug use: No       OPHTHALMIC EXAM:  Base Eye Exam     Visual Acuity (Snellen -  Linear)       Right Left   Dist [place] 20/20 20/150   Dist ph [place]  NI         Tonometry (Tonopen, 9:58 AM)       Right Left   Pressure 19 19         Pupils       Dark Light Shape React APD   Right 3 2 Round Brisk None   Left 3 2 Round Brisk None         Visual Fields       Left Right    Full Full         Extraocular Movement       Right Left    Full, Ortho Full, Ortho         Neuro/Psych     Oriented x3: Yes   Mood/Affect: Normal         Dilation     Both eyes: 1.0% Mydriacyl, 2.5% Phenylephrine @ 9:57 AM           Slit Lamp and Fundus Exam     Slit Lamp Exam        Right Left   Lids/Lashes Dermatochalasis - upper lid, Meibomian gland dysfunction Dermatochalasis - upper lid, Meibomian gland dysfunction   Conjunctiva/Sclera White and quiet White and quiet   Cornea trace PEE trace PEE   Anterior Chamber deep and clear deep and clear   Iris Round and dilated Round and dilated   Lens Clear Clear   Anterior Vitreous clear clear         Fundus Exam       Right Left   Disc Pink and Sharp Pink and Sharp, focal PPP temporal   C/D Ratio 0.3 0.3   Macula Flat, Good foveal reflex, No heme or edema Flat, Good foveal reflex, No heme or edema   Vessels mild copper wiring Normal   Periphery Attached, flap tear at 130 ora -- good laser surrounding Attached            IMAGING AND PROCEDURES  Imaging and Procedures for 02/21/2022  OCT, Retina - OU - Both Eyes       Right Eye Quality was good. Central Foveal Thickness: 290. Progression has been stable. Findings include normal foveal contour, no IRF, no SRF, vitreomacular adhesion .   Left Eye Quality was good. Central Foveal Thickness: 289. Progression has been stable. Findings include normal foveal contour, no IRF, no SRF.   Notes *Images captured and stored on drive  Diagnosis / Impression:  NFP, no IRF/SRF OU  Clinical management:  See below  Abbreviations: NFP - Normal foveal profile. CME - cystoid macular edema. PED - pigment epithelial detachment. IRF - intraretinal fluid. SRF - subretinal fluid. EZ - ellipsoid zone. ERM - epiretinal membrane. ORA - outer retinal atrophy. ORT - outer retinal tubulation. SRHM - subretinal hyper-reflective material. IRHM - intraretinal hyper-reflective material            ASSESSMENT/PLAN:    ICD-10-CM   1. Retinal tear of right eye  H33.311 OCT, Retina - OU - Both Eyes    2. Amblyopia of left eye  H53.002      Retinal tear, OD - small flap tear located at 0130 ora - s/p laser retinopexy (08.04.23) -- good laser surrounidng - completed PF QID OD  x7 days - no new RT/RD or lattice - f/u in 2-3 months, sooner prn DFE, OCT  2. Amblyopia OS  -  patched as a child  - BCVA ~20/80 by MRx  Ophthalmic Meds Ordered this visit:  No orders of the defined types were placed in this encounter.    Return for f/u 2-3 months, retinal tear OD, DFE, OCT.  There are no Patient Instructions on file for this visit.   Explained the diagnoses, plan, and follow up with the patient and they expressed understanding.  Patient expressed understanding of the importance of proper follow up care.   This document serves as a record of services personally performed by Karie Chimera, MD, PhD. It was created on their behalf by Glee Arvin. Manson Passey, OA an ophthalmic technician. The creation of this record is the provider's dictation and/or activities during the visit.    Electronically signed by: Glee Arvin. Kristopher Oppenheim 08.11.2023 12:38 PM  Karie Chimera, M.D., Ph.D. Diseases & Surgery of the Retina and Vitreous [facility]  I have reviewed the above documentation for accuracy and completeness, and I agree with the above. Karie Chimera, M.D., Ph.D. 02/21/22 12:38 PM  Abbreviations: M myopia (nearsighted); A astigmatism; H hyperopia (farsighted); P presbyopia; Mrx spectacle prescription;  CTL contact lenses; OD right eye; OS left eye; OU both eyes  XT exotropia; ET esotropia; PEK punctate epithelial keratitis; PEE punctate epithelial erosions; DES dry eye syndrome; MGD meibomian gland dysfunction; ATs artificial tears; PFAT's preservative free artificial tears; NSC nuclear sclerotic cataract; PSC posterior subcapsular cataract; ERM epi-retinal membrane; PVD posterior vitreous detachment; RD retinal detachment; DM diabetes mellitus; DR diabetic retinopathy; NPDR non-proliferative diabetic retinopathy; PDR proliferative diabetic retinopathy; CSME clinically significant macular edema; DME diabetic macular edema; dbh dot blot hemorrhages; CWS cotton  wool spot; POAG primary open angle glaucoma; C/D cup-to-disc ratio; HVF humphrey visual field; GVF goldmann visual field; OCT optical coherence tomography; IOP intraocular pressure; BRVO Branch retinal vein occlusion; CRVO central retinal vein occlusion; CRAO central retinal artery occlusion; BRAO branch retinal artery occlusion; RT retinal tear; SB scleral buckle; PPV pars plana vitrectomy; VH Vitreous hemorrhage; PRP panretinal laser photocoagulation; IVK intravitreal kenalog; VMT vitreomacular traction; MH Macular hole;  NVD neovascularization of the disc; NVE neovascularization elsewhere; AREDS age related eye disease study; ARMD age related macular degeneration; POAG primary open angle glaucoma; EBMD epithelial/anterior basement membrane dystrophy; ACIOL anterior chamber intraocular lens; IOL intraocular lens; PCIOL posterior chamber intraocular lens; Phaco/IOL phacoemulsification with intraocular lens placement; PRK photorefractive keratectomy; LASIK laser assisted in situ keratomileusis; HTN hypertension; DM diabetes mellitus; COPD chronic obstructive pulmonary disease

## 2022-02-21 ENCOUNTER — Ambulatory Visit (INDEPENDENT_AMBULATORY_CARE_PROVIDER_SITE_OTHER): Payer: BC Managed Care – PPO | Admitting: Ophthalmology

## 2022-02-21 ENCOUNTER — Encounter (INDEPENDENT_AMBULATORY_CARE_PROVIDER_SITE_OTHER): Payer: Self-pay | Admitting: Ophthalmology

## 2022-02-21 DIAGNOSIS — H33311 Horseshoe tear of retina without detachment, right eye: Secondary | ICD-10-CM

## 2022-02-21 DIAGNOSIS — H53002 Unspecified amblyopia, left eye: Secondary | ICD-10-CM

## 2022-05-16 NOTE — Progress Notes (Shared)
[facility] - Clinic Note  05/30/2022     CHIEF COMPLAINT Patient presents for No chief complaint on file.   HISTORY OF PRESENT ILLNESS: Martha Mack is a 36 y.o. female who presents to the clinic today for:    Pt states no problems after the laser procedure, she is still seeing floaters and fol, but the fol have gotten smaller, she used the drops for a week as directed   Referring physician: No referring provider defined for this encounter.  HISTORICAL INFORMATION:   Selected notes from the MEDICAL RECORD NUMBER Referred by Dr. Clydene Pugh for concern of retinal hole OD LEE:  Ocular Hx- PMH-    CURRENT MEDICATIONS: No current outpatient medications on file. (Ophthalmic Drugs)   No current facility-administered medications for this visit. (Ophthalmic Drugs)   Current Outpatient Medications (Other)  Medication Sig   ALPRAZolam (XANAX) 0.5 MG tablet TAKE 1 TABLET BY MOUTH BEFORE FLYING AS NEEDED (Patient not taking: Reported on 01/31/2022)   levonorgestrel (MIRENA) 20 MCG/24HR IUD 1 each by Intrauterine route once.   metoprolol succinate (TOPROL-XL) 50 MG 24 hr tablet Take 1 tablet by mouth daily. (Patient not taking: Reported on 01/31/2022)   No current facility-administered medications for this visit. (Other)   REVIEW OF SYSTEMS:   ALLERGIES Allergies  Allergen Reactions   Ceclor [Cefaclor] Hives   Morphine And Related Hives   PAST MEDICAL HISTORY Past Medical History:  Diagnosis Date   Anxiety    Fetal demise, greater than 22 weeks, antepartum 2006   from abruption with G2   HELLP syndrome    2006-with renal failure   Morbid obesity with BMI of 40.0-44.9, adult (HCC)    Placental abruption    Postpartum depression associated with second pregnancy 2007   Pre-eclampsia    G2   Past Surgical History:  Procedure Laterality Date   CESAREAN SECTION  2005/2006   DEBRIDEMENT AND CLOSURE WOUND     HYSTEROSCOPY WITH D & C  02/27/2004    INTRAUTERINE DEVICE (IUD) INSERTION  02/16/2013   IUD REMOVAL  02/16/2013   FAMILY HISTORY Family History  Problem Relation Age of Onset   Stroke Father    Heart disease Maternal Grandfather    Breast cancer Other 65   Cerebral palsy Paternal Aunt    Lung cancer Other    Heart disease Maternal Uncle    SOCIAL HISTORY Social History   Tobacco Use   Smoking status: Never   Smokeless tobacco: Never  Vaping Use   Vaping Use: Never used  Substance Use Topics   Alcohol use: No   Drug use: No       OPHTHALMIC EXAM:  Not recorded     IMAGING AND PROCEDURES  Imaging and Procedures for 05/30/2022          ASSESSMENT/PLAN:    ICD-10-CM   1. Retinal tear of right eye  H33.311     2. Amblyopia of left eye  H53.002      Retinal tear, OD - small flap tear located at 0130 ora - s/p laser retinopexy (08.04.23) -- good laser surrounidng - completed PF QID OD x7 days - no new RT/RD or lattice - f/u in 2-3 months, sooner prn DFE, OCT  2. Amblyopia OS  - patched as a child  - BCVA ~20/80 by MRx  Ophthalmic Meds Ordered this visit:  No orders of the defined types were placed in this encounter.    No follow-ups on file.  There are no Patient Instructions on file for this visit.   Explained the diagnoses, plan, and follow up with the patient and they expressed understanding.  Patient expressed understanding of the importance of proper follow up care.   This document serves as a record of services personally performed by Karie Chimera, MD, PhD. It was created on their behalf by Glee Arvin. Manson Passey, OA an ophthalmic technician. The creation of this record is the provider's dictation and/or activities during the visit.    Electronically signed by: Glee Arvin. Manson Passey, [place] 11.17.2023 7:48 AM   Karie Chimera, M.D., Ph.D. Diseases & Surgery of the Retina and Vitreous [facility]    Abbreviations: M myopia (nearsighted); A astigmatism; H hyperopia  (farsighted); P presbyopia; Mrx spectacle prescription;  CTL contact lenses; OD right eye; OS left eye; OU both eyes  XT exotropia; ET esotropia; PEK punctate epithelial keratitis; PEE punctate epithelial erosions; DES dry eye syndrome; MGD meibomian gland dysfunction; ATs artificial tears; PFAT's preservative free artificial tears; NSC nuclear sclerotic cataract; PSC posterior subcapsular cataract; ERM epi-retinal membrane; PVD posterior vitreous detachment; RD retinal detachment; DM diabetes mellitus; DR diabetic retinopathy; NPDR non-proliferative diabetic retinopathy; PDR proliferative diabetic retinopathy; CSME clinically significant macular edema; DME diabetic macular edema; dbh dot blot hemorrhages; CWS cotton wool spot; POAG primary open angle glaucoma; C/D cup-to-disc ratio; HVF humphrey visual field; GVF goldmann visual field; OCT optical coherence tomography; IOP intraocular pressure; BRVO Branch retinal vein occlusion; CRVO central retinal vein occlusion; CRAO central retinal artery occlusion; BRAO branch retinal artery occlusion; RT retinal tear; SB scleral buckle; PPV pars plana vitrectomy; VH Vitreous hemorrhage; PRP panretinal laser photocoagulation; IVK intravitreal kenalog; VMT vitreomacular traction; MH Macular hole;  NVD neovascularization of the disc; NVE neovascularization elsewhere; AREDS age related eye disease study; ARMD age related macular degeneration; POAG primary open angle glaucoma; EBMD epithelial/anterior basement membrane dystrophy; ACIOL anterior chamber intraocular lens; IOL intraocular lens; PCIOL posterior chamber intraocular lens; Phaco/IOL phacoemulsification with intraocular lens placement; PRK photorefractive keratectomy; LASIK laser assisted in situ keratomileusis; HTN hypertension; DM diabetes mellitus; COPD chronic obstructive pulmonary disease

## 2022-05-30 ENCOUNTER — Encounter (INDEPENDENT_AMBULATORY_CARE_PROVIDER_SITE_OTHER): Payer: BC Managed Care – PPO | Admitting: Ophthalmology

## 2022-05-30 DIAGNOSIS — H33311 Horseshoe tear of retina without detachment, right eye: Secondary | ICD-10-CM

## 2022-05-30 DIAGNOSIS — H53002 Unspecified amblyopia, left eye: Secondary | ICD-10-CM
